# Patient Record
Sex: Female | Born: 1990
Health system: Southern US, Community
[De-identification: ages and names within clinical notes are randomized; demographics above are authoritative.]

## PROBLEM LIST (undated history)

## (undated) ENCOUNTER — Emergency Department: Admission: EM | Discharge status: Absent without leave | Payer: BLUE CROSS/BLUE SHIELD

## (undated) DIAGNOSIS — D649 Anemia, unspecified: Secondary | ICD-10-CM

## (undated) DIAGNOSIS — Z9289 Personal history of other medical treatment: Secondary | ICD-10-CM

---
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
## 2010-02-27 ENCOUNTER — Other Ambulatory Visit: Payer: Self-pay | Admitting: Emergency Medicine

## 2010-02-28 ENCOUNTER — Observation Stay (HOSPITAL_COMMUNITY): Admission: AD | Admit: 2010-02-28 | Discharge: 2010-02-28 | Payer: Self-pay | Admitting: Obstetrics and Gynecology

## 2010-06-08 ENCOUNTER — Emergency Department (HOSPITAL_COMMUNITY): Admission: EM | Admit: 2010-06-08 | Discharge: 2010-06-08 | Payer: Self-pay | Admitting: Emergency Medicine

## 2011-02-15 LAB — POCT I-STAT, CHEM 8
BUN: 6 mg/dL (ref 6–23)
Calcium, Ion: 1.14 mmol/L (ref 1.12–1.32)
Chloride: 113 mEq/L — ABNORMAL HIGH (ref 96–112)
Creatinine, Ser: 0.8 mg/dL (ref 0.4–1.2)
Glucose, Bld: 102 mg/dL — ABNORMAL HIGH (ref 70–99)
HCT: 37 % (ref 36.0–46.0)
Hemoglobin: 12.6 g/dL (ref 12.0–15.0)
Potassium: 4 mEq/L (ref 3.5–5.1)
Sodium: 142 mEq/L (ref 135–145)
TCO2: 18 mmol/L (ref 0–100)

## 2011-02-18 LAB — VON WILLEBRAND FACTOR MULTIMER
Factor-VIII Activity: 144 % (ref 50–180)
Ristocetin Co-Factor: 130 % (ref 42–200)
Von Willebrand Factor Ag: 167 % (ref 50–217)

## 2011-02-18 LAB — CROSSMATCH
ABO/RH(D): A POS
Antibody Screen: NEGATIVE

## 2011-02-18 LAB — PLATELET FUNCTION ASSAY: Collagen / Epinephrine: 143 seconds (ref 0–184)

## 2011-02-18 LAB — CBC
HCT: 24.8 % — ABNORMAL LOW (ref 36.0–46.0)
Hemoglobin: 8 g/dL — ABNORMAL LOW (ref 12.0–15.0)
MCHC: 32 g/dL (ref 30.0–36.0)
MCV: 76.8 fL — ABNORMAL LOW (ref 78.0–100.0)
Platelets: 217 10*3/uL (ref 150–400)
RBC: 3.23 MIL/uL — ABNORMAL LOW (ref 3.87–5.11)
RDW: 26.2 % — ABNORMAL HIGH (ref 11.5–15.5)
WBC: 9.5 10*3/uL (ref 4.0–10.5)

## 2011-02-18 LAB — APTT: aPTT: 29 seconds (ref 24–37)

## 2011-02-18 LAB — PROTIME-INR
INR: 1.15 (ref 0.00–1.49)
Prothrombin Time: 14.6 seconds (ref 11.6–15.2)

## 2011-02-18 LAB — ABO/RH: ABO/RH(D): A POS

## 2011-02-23 LAB — CBC
HCT: 15.8 % — ABNORMAL LOW (ref 36.0–46.0)
Hemoglobin: 4.9 g/dL — CL (ref 12.0–15.0)
MCHC: 31.2 g/dL (ref 30.0–36.0)
MCV: 63.1 fL — ABNORMAL LOW (ref 78.0–100.0)
Platelets: 324 10*3/uL (ref 150–400)
RBC: 2.5 MIL/uL — ABNORMAL LOW (ref 3.87–5.11)
RDW: 19.1 % — ABNORMAL HIGH (ref 11.5–15.5)
WBC: 9 10*3/uL (ref 4.0–10.5)

## 2011-02-23 LAB — BASIC METABOLIC PANEL
BUN: 11 mg/dL (ref 6–23)
CO2: 23 mEq/L (ref 19–32)
Calcium: 9 mg/dL (ref 8.4–10.5)
Chloride: 105 mEq/L (ref 96–112)
Creatinine, Ser: 0.75 mg/dL (ref 0.4–1.2)
GFR calc Af Amer: >60 mL/min (ref >60)
GFR calc non Af Amer: >60 mL/min (ref >60)
Glucose, Bld: 110 mg/dL — ABNORMAL HIGH (ref 70–99)
Potassium: 3.4 mEq/L — ABNORMAL LOW (ref 3.5–5.1)
Sodium: 136 mEq/L (ref 135–145)

## 2011-02-23 LAB — DIFFERENTIAL
Band Neutrophils: 0 % (ref 0–10)
Basophils Absolute: 0 10*3/uL (ref 0.0–0.1)
Basophils Relative: 0 % (ref 0–1)
Blasts: 0 %
Eosinophils Absolute: 0.1 10*3/uL (ref 0.0–0.7)
Eosinophils Relative: 1 % (ref 0–5)
Lymphocytes Relative: 33 % (ref 12–46)
Lymphs Abs: 3 10*3/uL (ref 0.7–4.0)
Metamyelocytes Relative: 0 %
Monocytes Absolute: 0.4 10*3/uL (ref 0.1–1.0)
Monocytes Relative: 4 % (ref 3–12)
Myelocytes: 0 %
Neutro Abs: 5.5 10*3/uL (ref 1.7–7.7)
Neutrophils Relative %: 62 % (ref 43–77)
Promyelocytes Absolute: 0 %
nRBC: 0 /100 WBC

## 2011-02-23 LAB — TYPE AND SCREEN
ABO/RH(D): A POS
Antibody Screen: NEGATIVE

## 2011-02-23 LAB — RETICULOCYTES
RBC.: 2.35 MIL/uL — ABNORMAL LOW (ref 3.87–5.11)
Retic Count, Absolute: 32.9 10*3/uL (ref 19.0–186.0)
Retic Ct Pct: 1.4 % (ref 0.4–3.1)

## 2011-02-23 LAB — POCT PREGNANCY, URINE: Preg Test, Ur: NEGATIVE

## 2011-02-23 LAB — IRON AND TIBC
Iron: <10 ug/dL — ABNORMAL LOW (ref 42–135)
UIBC: 369 ug/dL

## 2011-02-23 LAB — FOLATE: Folate: 13.6 ng/mL

## 2011-02-23 LAB — VITAMIN B12: Vitamin B-12: 289 pg/mL (ref 211–911)

## 2011-02-23 LAB — ABO/RH: ABO/RH(D): A POS

## 2011-02-23 LAB — FERRITIN: Ferritin: 1 ng/mL — ABNORMAL LOW (ref 10–291)

## 2014-08-29 ENCOUNTER — Observation Stay (HOSPITAL_COMMUNITY)
Admission: EM | Admit: 2014-08-29 | Discharge: 2014-08-30 | Disposition: A | Discharge status: Home / Self Care | Payer: 59 | Attending: Obstetrics & Gynecology | Admitting: Obstetrics & Gynecology

## 2014-08-29 ENCOUNTER — Encounter (HOSPITAL_COMMUNITY): Payer: Self-pay | Admitting: Emergency Medicine

## 2014-08-29 DIAGNOSIS — R5383 Other fatigue: Secondary | ICD-10-CM | POA: Insufficient documentation

## 2014-08-29 DIAGNOSIS — N926 Irregular menstruation, unspecified: Secondary | ICD-10-CM | POA: Diagnosis present

## 2014-08-29 DIAGNOSIS — D649 Anemia, unspecified: Secondary | ICD-10-CM | POA: Diagnosis not present

## 2014-08-29 DIAGNOSIS — N939 Abnormal uterine and vaginal bleeding, unspecified: Secondary | ICD-10-CM | POA: Diagnosis not present

## 2014-08-29 DIAGNOSIS — N938 Other specified abnormal uterine and vaginal bleeding: Principal | ICD-10-CM | POA: Insufficient documentation

## 2014-08-29 DIAGNOSIS — R55 Syncope and collapse: Secondary | ICD-10-CM | POA: Insufficient documentation

## 2014-08-29 DIAGNOSIS — D582 Other hemoglobinopathies: Secondary | ICD-10-CM | POA: Insufficient documentation

## 2014-08-29 DIAGNOSIS — N920 Excessive and frequent menstruation with regular cycle: Secondary | ICD-10-CM | POA: Diagnosis present

## 2014-08-29 DIAGNOSIS — R0602 Shortness of breath: Secondary | ICD-10-CM | POA: Insufficient documentation

## 2014-08-29 HISTORY — DX: Personal history of other medical treatment: Z92.89

## 2014-08-29 HISTORY — DX: Anemia, unspecified: D64.9

## 2014-08-29 NOTE — ED Notes (Signed)
Pt in triage eating a salad, pt alert and oriented x4

## 2014-08-29 NOTE — ED Provider Notes (Signed)
CSN: [id number]     Arrival date & time 08/29/14  2012 History   First MD Initiated Contact with Patient 08/29/14 2310     Chief Complaint  Patient presents with  . Abnormal Lab     (Consider location/radiation/quality/duration/timing/severity/associated sxs/prior Treatment) HPI Taylor White is a 23 y.o. female with past medical history of the regular vaginal bleeding coming in with low blood count. Patient has had viral URI like symptoms for the past 5 days. She describes headache rhinorrhea and a nonproductive cough. She was seen in urgent care where they obtained blood work and hemoglobin was 5.6. Patient was sent here for admission and blood transfusion. She states she's had vaginal bleeding for the past 3 weeks. She states this occurs to her approximately once a year and she's required transfusions in the past.  She denies any history of bleeding disorders in the family. She was taking birth control pills but stopped taking them one year ago. She denies any new birth control over the past couple of months. She has no dysuria hematuria or vaginal discharge. She's had no fevers chills or diaphoresis. She does admit to shortness of breath, fatigue, and syncope. 4 days ago while walking in the park patient became lightheaded and had a syncopal episode but never sought care at that time. Patient has no further complaints.  10 Systems reviewed and are negative for acute change except as noted in the HPI.    Past Medical History  Diagnosis Date  . Anemia   . H/O transfusion of packed red blood cells    History reviewed. No pertinent past surgical history. History reviewed. No pertinent family history. History  Substance Use Topics  . Smoking status: Current Every Day Smoker  . Smokeless tobacco: Not on file  . Alcohol Use: Yes   OB History   Grav Para Term Preterm Abortions TAB SAB Ect Mult Living                 Review of Systems    Allergies  Review of patient's allergies  indicates no known allergies.  Home Medications   Prior to Admission medications   Not on File   BP 130/68  Pulse 112  Temp(Src) 99.1 F (37.3 C) (Oral)  Resp 16  Ht 5\' 3"  (1.6 m)  Wt 120 lb (54.432 kg)  BMI 21.26 kg/m2  SpO2 100%  LMP 08/29/2014 Physical Exam  Nursing note and vitals reviewed. Constitutional: She is oriented to person, place, and time. She appears well-developed and well-nourished. No distress.  HENT:  Head: Normocephalic and atraumatic.  Nose: Nose normal.  Mouth/Throat: Oropharynx is clear and moist. No oropharyngeal exudate.  Eyes: Conjunctivae and EOM are normal. Pupils are equal, round, and reactive to light. No scleral icterus.  Conjunctivae are not pale on exam.  Neck: Normal range of motion. Neck supple. No JVD present. No tracheal deviation present. No thyromegaly present.  Cardiovascular: Regular rhythm and normal heart sounds.  Exam reveals no gallop and no friction rub.   No murmur heard. Tachycardia  Pulmonary/Chest: Effort normal and breath sounds normal. No respiratory distress. She has no wheezes. She exhibits no tenderness.  Abdominal: Soft. Bowel sounds are normal. She exhibits no distension and no mass. There is no tenderness. There is no rebound and no guarding.  Genitourinary: Vagina normal. No vaginal discharge found.  Mild to moderate amount of blood seen in the vaginal vault. No source other than the cervix identified.  Musculoskeletal: Normal range of motion.  She exhibits no edema and no tenderness.  Lymphadenopathy:    She has no cervical adenopathy.  Neurological: She is alert and oriented to person, place, and time. No cranial nerve deficit. She exhibits normal muscle tone.  Skin: Skin is warm and dry. No rash noted. She is not diaphoretic. No erythema. No pallor.    ED Course  Procedures (including critical care time) Labs Review Labs Reviewed  WET PREP, GENITAL - Abnormal; Notable for the following:    Clue Cells Wet Prep  HPF POC FEW (*)    All other components within normal limits  CBC WITH DIFFERENTIAL - Abnormal; Notable for the following:    RBC 2.61 (*)    Hemoglobin 5.5 (*)    HCT 18.1 (*)    MCV 69.3 (*)    MCH 21.1 (*)    RDW 16.2 (*)    All other components within normal limits  GC/CHLAMYDIA PROBE AMP  BASIC METABOLIC PANEL  HCG, SERUM, QUALITATIVE  URINALYSIS, ROUTINE W REFLEX MICROSCOPIC  TYPE AND SCREEN  PREPARE RBC (CROSSMATCH)    Imaging Review No results found.   EKG Interpretation None      MDM   Final diagnoses:  None    Patient presents emergency department out of concern for her low blood count. She's had irregular vaginal bleeding in the past requiring blood transfusion. She denies being told a diagnosis for this. She's currently not having any abdominal pain. Will evaluate with labs and a pelvic exam.  Hemoglobin is 5.5. Patient was ordered 2 units blood transfusion. I spoke with Dr. standard at [facility] hospital who will admit this patient for continued care.    Tomasita Crumble, MD 08/30/14 [phone]

## 2014-08-29 NOTE — ED Notes (Signed)
Pt presents with headache, sinus pressure, and poor appetite. [facility], they ran a blood test and sent pt here for as blood transfusion for a Hgb of 5.6. Pt states she has a hx of the same. Pt has paperwork with her to show low Hgb

## 2014-08-30 ENCOUNTER — Observation Stay (HOSPITAL_COMMUNITY): Payer: 59

## 2014-08-30 ENCOUNTER — Encounter (HOSPITAL_COMMUNITY): Payer: Self-pay | Admitting: *Deleted

## 2014-08-30 DIAGNOSIS — D582 Other hemoglobinopathies: Secondary | ICD-10-CM | POA: Diagnosis present

## 2014-08-30 DIAGNOSIS — R55 Syncope and collapse: Secondary | ICD-10-CM | POA: Diagnosis not present

## 2014-08-30 DIAGNOSIS — N938 Other specified abnormal uterine and vaginal bleeding: Secondary | ICD-10-CM | POA: Diagnosis not present

## 2014-08-30 DIAGNOSIS — N920 Excessive and frequent menstruation with regular cycle: Secondary | ICD-10-CM | POA: Diagnosis present

## 2014-08-30 DIAGNOSIS — D649 Anemia, unspecified: Secondary | ICD-10-CM | POA: Diagnosis not present

## 2014-08-30 DIAGNOSIS — R0602 Shortness of breath: Secondary | ICD-10-CM | POA: Diagnosis not present

## 2014-08-30 DIAGNOSIS — R5383 Other fatigue: Secondary | ICD-10-CM | POA: Diagnosis not present

## 2014-08-30 LAB — BASIC METABOLIC PANEL
Anion gap: 14 (ref 5–15)
BUN: 8 mg/dL (ref 6–23)
CO2: 23 mEq/L (ref 19–32)
Calcium: 9 mg/dL (ref 8.4–10.5)
Chloride: 102 mEq/L (ref 96–112)
Creatinine, Ser: 0.77 mg/dL (ref 0.50–1.10)
GFR calc Af Amer: >90 mL/min (ref >90)
GFR calc non Af Amer: >90 mL/min (ref >90)
Glucose, Bld: 90 mg/dL (ref 70–99)
Potassium: 3.8 mEq/L (ref 3.7–5.3)
Sodium: 139 mEq/L (ref 137–147)

## 2014-08-30 LAB — CBC
HCT: 28.7 % — ABNORMAL LOW (ref 36.0–46.0)
Hemoglobin: 9.3 g/dL — ABNORMAL LOW (ref 12.0–15.0)
MCH: 24.5 pg — ABNORMAL LOW (ref 26.0–34.0)
MCHC: 32.4 g/dL (ref 30.0–36.0)
MCV: 75.7 fL — ABNORMAL LOW (ref 78.0–100.0)
Platelets: 261 10*3/uL (ref 150–400)
RBC: 3.79 MIL/uL — ABNORMAL LOW (ref 3.87–5.11)
RDW: 17.8 % — ABNORMAL HIGH (ref 11.5–15.5)
WBC: 6.3 10*3/uL (ref 4.0–10.5)

## 2014-08-30 LAB — URINE MICROSCOPIC-ADD ON

## 2014-08-30 LAB — CBC WITH DIFFERENTIAL/PLATELET
Basophils Absolute: 0 10*3/uL (ref 0.0–0.1)
Basophils Relative: 0 % (ref 0–1)
Eosinophils Absolute: 0.2 10*3/uL (ref 0.0–0.7)
Eosinophils Relative: 3 % (ref 0–5)
HCT: 18.1 % — ABNORMAL LOW (ref 36.0–46.0)
Hemoglobin: 5.5 g/dL — CL (ref 12.0–15.0)
Lymphocytes Relative: 26 % (ref 12–46)
Lymphs Abs: 1.8 10*3/uL (ref 0.7–4.0)
MCH: 21.1 pg — ABNORMAL LOW (ref 26.0–34.0)
MCHC: 30.4 g/dL (ref 30.0–36.0)
MCV: 69.3 fL — ABNORMAL LOW (ref 78.0–100.0)
Monocytes Absolute: 0.6 10*3/uL (ref 0.1–1.0)
Monocytes Relative: 9 % (ref 3–12)
Neutro Abs: 4.3 10*3/uL (ref 1.7–7.7)
Neutrophils Relative %: 62 % (ref 43–77)
Platelets: 308 10*3/uL (ref 150–400)
RBC: 2.61 MIL/uL — ABNORMAL LOW (ref 3.87–5.11)
RDW: 16.2 % — ABNORMAL HIGH (ref 11.5–15.5)
WBC: 6.9 10*3/uL (ref 4.0–10.5)

## 2014-08-30 LAB — WET PREP, GENITAL
Trich, Wet Prep: NONE SEEN
WBC, Wet Prep HPF POC: NONE SEEN
Yeast Wet Prep HPF POC: NONE SEEN

## 2014-08-30 LAB — URINALYSIS, ROUTINE W REFLEX MICROSCOPIC
Bilirubin Urine: NEGATIVE
Glucose, UA: NEGATIVE mg/dL
Ketones, ur: 15 mg/dL — AB
Nitrite: POSITIVE — AB
Protein, ur: >300 mg/dL — AB
Specific Gravity, Urine: 1.02 (ref 1.005–1.030)
Urobilinogen, UA: 4 mg/dL — ABNORMAL HIGH (ref 0.0–1.0)
pH: 6.5 (ref 5.0–8.0)

## 2014-08-30 LAB — PREPARE RBC (CROSSMATCH)

## 2014-08-30 LAB — TSH: TSH: 1.33 u[IU]/mL (ref 0.350–4.500)

## 2014-08-30 LAB — HCG, SERUM, QUALITATIVE: Preg, Serum: NEGATIVE

## 2014-08-30 LAB — HCG, QUANTITATIVE, PREGNANCY: hCG, Beta Chain, Quant, S: 1 m[IU]/mL (ref <5)

## 2014-08-30 MED ORDER — MEDROXYPROGESTERONE ACETATE 150 MG/ML IM SUSP
150.0000 mg | Freq: Once | INTRAMUSCULAR | Status: AC
  Administered 2014-08-30: 150 mg via INTRAMUSCULAR
  Filled 2014-08-30: qty 1

## 2014-08-30 MED ORDER — SODIUM CHLORIDE 0.9 % IV SOLN: Freq: Once | INTRAVENOUS | Status: DC

## 2014-08-30 MED ORDER — PRENATAL MULTIVITAMIN CH
1.0000 | ORAL_TABLET | Freq: Every day | ORAL | Status: DC
  Administered 2014-08-30: 1 via ORAL
  Filled 2014-08-30: qty 1

## 2014-08-30 MED ORDER — AZITHROMYCIN 250 MG PO TABS: ORAL_TABLET | ORAL | Status: DC

## 2014-08-30 MED ORDER — SODIUM CHLORIDE 0.9 % IV SOLN
Freq: Once | INTRAVENOUS | Status: AC
  Administered 2014-08-30: 07:00:00 via INTRAVENOUS

## 2014-08-30 MED ORDER — ACETAMINOPHEN 500 MG PO TABS
1000.0000 mg | ORAL_TABLET | Freq: Once | ORAL | Status: AC
  Administered 2014-08-30: 1000 mg via ORAL
  Filled 2014-08-30: qty 2

## 2014-08-30 MED ORDER — SODIUM CHLORIDE 0.9 % IV SOLN
10.0000 mL/h | Freq: Once | INTRAVENOUS | Status: AC
  Administered 2014-08-30: 10 mL/h via INTRAVENOUS

## 2014-08-30 NOTE — Discharge Instructions (Signed)
Abnormal Uterine Bleeding Abnormal uterine bleeding can affect women at various stages in life, including teenagers, women in their reproductive years, pregnant women, and women who have reached menopause. Several kinds of uterine bleeding are considered abnormal, including:  Bleeding or spotting between periods.   Bleeding after sexual intercourse.   Bleeding that is heavier or more than normal.   Periods that last longer than usual.  Bleeding after menopause.  Many cases of abnormal uterine bleeding are minor and simple to treat, while others are more serious. Any type of abnormal bleeding should be evaluated by your health care provider. Treatment will depend on the cause of the bleeding. HOME CARE INSTRUCTIONS Monitor your condition for any changes. The following actions may help to alleviate any discomfort you are experiencing:  Avoid the use of tampons and douches as directed by your health care provider.  Change your pads frequently. You should get regular pelvic exams and Pap tests. Keep all follow-up appointments for diagnostic tests as directed by your health care provider.  SEEK MEDICAL CARE IF:   Your bleeding lasts more than 1 week.   You feel dizzy at times.  SEEK IMMEDIATE MEDICAL CARE IF:   You pass out.   You are changing pads every 15 to 30 minutes.   You have abdominal pain.  You have a fever.   You become sweaty or weak.   You are passing large blood clots from the vagina.   You start to feel nauseous and vomit. MAKE SURE YOU:   Understand these instructions.  Will watch your condition.  Will get help right away if you are not doing well or get worse. Document Released: 11/16/2005 Document Revised: 11/21/2013 Document Reviewed: 06/15/2013 ExitCare Patient Information 2015 [employer]. This information is not intended to replace advice given to you by your health care provider. Make sure you discuss any questions you have with your  health care provider.  

## 2014-08-30 NOTE — Progress Notes (Signed)
UR completed 

## 2014-08-30 NOTE — ED Notes (Signed)
Pt reports she has been having a heavy menstrual cycle x3 weeks, pt reports a heavy blood flow with large blood clots. Pt reports changing her sanitary pads more often than normal. Pt alert and oriented x4. Pt currently sleeping, RN at bedside while initial blood administration is going.

## 2014-08-30 NOTE — ED Notes (Signed)
Pt c/o pain at IV site, IV line flushed and patent, no signs of swelling noted to site. 2nd RN Windy KalataYasemia RN at bedside to exam IV site as well. This RN offered to start a 2nd line for her, pt refused a second IV start at this time. Pt states "I'll just keep it."

## 2014-08-30 NOTE — H&P (Signed)
Taylor White is a 23 y.o. female with past medical history of the regular vaginal bleeding coming in with low blood count. Patient has had viral URI like symptoms for the past 5 days. She describes headache rhinorrhea and a nonproductive cough. She was seen in urgent care where they obtained blood work and hemoglobin was 5.6. Patient was sent here for admission and blood transfusion. She states she's had vaginal bleeding for the past 3 weeks. She says that her bleeding is much lighter for the last few days. She used depo provera for several years in the past and had no heavy bleeding during that time. She states this occurs to her approximately once a year and she's required 2 transfusions in the past. She denies any history of bleeding disorders in the family. She was taking birth control pills but stopped taking them one year ago. She denies any new birth control over the past couple of months. She has no dysuria hematuria or vaginal discharge. She's had no fevers chills or diaphoresis. She does admit to shortness of breath, fatigue, and syncope. 4 days ago while walking in the park patient became lightheaded and had a syncopal episode but never sought care at that time. Patient has no further complaints.  10 Systems reviewed and are negative for acute change except as noted in the HPI.  Past Medical History   Diagnosis  Date   .  Anemia    .  H/O transfusion of packed red blood cells    History reviewed. No pertinent past surgical history.  History reviewed. No pertinent family history.  History   Substance Use Topics   .  Smoking status:  Current Every Day Smoker   .  Smokeless tobacco:  Not on file   .  Alcohol Use:  Yes    OB History    Grav  Para  Term  Preterm  Abortions  TAB  SAB  Ect  Mult  Living                 Review of Systems  Allergies   Review of patient's allergies indicates no known allergies.  Home Medications    Prior to Admission medications   Not on File   BP 130/68   Pulse 112  Temp(Src) 99.1 F (37.3 C) (Oral)  Resp 16  Ht 5\' 3"  (1.6 m)  Wt 120 lb (54.432 kg)  BMI 21.26 kg/m2  SpO2 100%  LMP 08/29/2014  Physical Exam  Nursing note and vitals reviewed.  Constitutional: She is oriented to person, place, and time. She appears well-developed and well-nourished. No distress.  HENT:  Head: Normocephalic and atraumatic.  Nose: Nose normal.  Mouth/Throat: Oropharynx is clear and moist. No oropharyngeal exudate.  Eyes: Conjunctivae and EOM are normal. Pupils are equal, round, and reactive to light. No scleral icterus.  Conjunctivae are not pale on exam.  Neck: Normal range of motion. Neck supple. No JVD present. No tracheal deviation present. No thyromegaly present.  Cardiovascular: Regular rhythm and normal heart sounds. Exam reveals no gallop and no friction rub.  No murmur heard. Tachycardia  Pulmonary/Chest: Effort normal and breath sounds normal. No respiratory distress. She has no wheezes. She exhibits no tenderness.  Abdominal: Soft. Bowel sounds are normal. She exhibits no distension and no mass. There is no tenderness. There is no rebound and no guarding.  Genitourinary: Vagina normal. No vaginal discharge found.  Mild to moderate amount of blood seen in the vaginal vault. No source  other than the cervix identified.  Musculoskeletal: Normal range of motion. She exhibits no edema and no tenderness.  Lymphadenopathy:  She has no cervical adenopathy.  Neurological: She is alert and oriented to person, place, and time. No cranial nerve deficit. She exhibits normal muscle tone.  Skin: Skin is warm and dry. No rash noted. She is not diaphoretic. No erythema. No pallor.  ED Course   Procedures (including critical care time)  Labs Review  Labs Reviewed   WET PREP, GENITAL - Abnormal; Notable for the following:    Clue Cells Wet Prep HPF POC  FEW (*)     All other components within normal limits   CBC WITH DIFFERENTIAL - Abnormal; Notable for the  following:    RBC  2.61 (*)     Hemoglobin  5.5 (*)     HCT  18.1 (*)     MCV  69.3 (*)     MCH  21.1 (*)     RDW  16.2 (*)     All other components within normal limits   GC/CHLAMYDIA PROBE AMP   BASIC METABOLIC PANEL   HCG, SERUM, QUALITATIVE   URINALYSIS, ROUTINE W REFLEX MICROSCOPIC   TYPE AND SCREEN   PREPARE RBC (CROSSMATCH)    No blood is seen on her pad at this time.  A/P. Symptomatic anemia.- She [facility] and I will give her another 2 units here. She will restart depo provera if her HCG is negative. I will get an u/s and work up for menorrhagia.

## 2014-08-30 NOTE — Progress Notes (Signed)
Pt and her mother verbalize understanding of d/c instructions, medication, future office appt., when to seek medical care, and signs of emergency. IVs were d/c. VS WNL at time of D/C. No questions at this time. Pt d/c to main entrance accompanied by NT. Pts mother will be driving her home. Pt received follow up CBC, as well as pelvic U/S as ordered prior to d/c. Pt also received Depo-Provera in rt deltoid prior to d/c. Sheryn BisonGordon, Jaylynn Siefert Warner

## 2014-08-30 NOTE — Plan of Care (Signed)
Problem: Phase I Progression Outcomes Goal: Pain controlled with appropriate interventions Outcome: Not Applicable Date Met:  92/00/41 Not having any complaints of pain

## 2014-08-30 NOTE — ED Notes (Signed)
A second IV was started per pt request. Pt states "that IV hurts too." IV team paged for further assistance. Pt denies pain at the first IV insertion at this time. IV RN at bedside

## 2014-08-30 NOTE — Discharge Summary (Signed)
Physician Discharge Summary  Patient ID: Taylor White MRN: [id number] DOB/AGE: Apr 12, 1991 23 y.o.  Admit date: 08/29/2014 Discharge date: 08/30/2014  Admission Diagnoses:  Menorrhagia, anemia, URI  Discharge Diagnoses: The same  Treatments: Transfusion of 3 units pRBCs  Hospital Course: 23 y.o. F [facility] anemia in the setting of menorrhagia, hemoglobin of 5.5. Of note, she presented initially with chronic URI and was incidentally noted toi be anemic.  No current heavy bleeding.  She received one unit of pRBCs and [facility] Here, she received 2 additional units and underwent a pelvic ultrasound. Discharge hemoglobin was 9.3.  Patient received one shot of Depo Provera prior to discharge; she will follow up in the GYN clinic. Z-pack prescribed for URI.  Significant Diagnostic Studies:  CBC Latest Ref Rng 08/30/2014 08/29/2014 06/08/2010  WBC 4.0 - 10.5 K/uL 6.3 6.9 -  Hemoglobin 12.0 - 15.0 g/dL 1.9(J9.3(L) 5.5(LL) 12.6  Hematocrit 36.0 - 46.0 % 28.7(L) 18.1(L) 37.0  Platelets 150 - 400 K/uL 261 308 -   08/30/2014   TRANSABDOMINAL AND TRANSVAGINAL ULTRASOUND OF PELVIS CLINICAL DATA:  Menorrhagia.  Anemia.  LMP 08/09/2014    TECHNIQUE: Both transabdominal and transvaginal ultrasound examinations of the pelvis were performed. Transabdominal technique was performed for global imaging of the pelvis including uterus, ovaries, adnexal regions, and pelvic cul-de-sac. It was necessary to proceed with endovaginal exam following the transabdominal exam to visualize the endometrial stripe and ovaries.  COMPARISON:  None  FINDINGS: Uterus  Measurements: 7.3 x 3.8 x 5.6 cm. No fibroids or other mass visualized.  Endometrium  Thickness: 4 mm.  No focal abnormality visualized.  Right ovary  Measurements: 6.0 x 2.6 x 3.4 cm. No evidence of ovarian mass. Numerous small, less than 1cm, follicles, without evidence of dominant follicle or corpus luteum.  Left ovary  Measurements: 5.4 x 2.2 x  3.8 cm. No evidence of ovarian mass. Numerous small, less than 1cm, follicles, without evidence of dominant follicle or corpus luteum.  Other findings  No free fluid.  IMPRESSION: No evidence of uterine fibroids.  Normal endometrial thickness.  No evidence of ovarian or adnexal mass.  Numerous small bilateral ovarian follicles, without evidence of dominant follicle or corpus luteum. These findings meet sonographic criteria for polycystic ovary syndrome; recommend clinical correlation for clinical signs and symptoms of PCOS, and consider biochemical testing for confirmation if warranted.   Electronically Signed   By: Myles RosenthalJohn  Stahl M.D.   On: 08/30/2014 16:43   Discharged Condition: Stable  Disposition: Stable to home     Medication List         azithromycin 250 MG tablet  Commonly known as:  ZITHROMAX Z-PAK  Take as directed       Follow-up Information   [facility]. (1:15 pm appointment with Dr. Debroah LoopArnold. Call clinic/come to [facility] for any concerning issues)    Contact information:   [address] [place] [address] [phone]      Signed: [name],[name], MD 08/30/2014, 5:37 PM

## 2014-08-31 LAB — TYPE AND SCREEN
ABO/RH(D): A POS
ABO/RH(D): A POS
Antibody Screen: NEGATIVE
Antibody Screen: NEGATIVE
Unit division: 0
Unit division: 0
Unit division: 0
Unit division: 0
Unit division: 0

## 2014-08-31 LAB — GC/CHLAMYDIA PROBE AMP
CT Probe RNA: POSITIVE — AB
GC Probe RNA: NEGATIVE

## 2014-09-03 LAB — VON WILLEBRAND PANEL
Coagulation Factor VIII: 240 % — ABNORMAL HIGH (ref 73–140)
Ristocetin Co-factor, Plasma: 147 % (ref 42–200)
Von Willebrand Antigen, Plasma: 191 % (ref 50–217)

## 2014-09-04 ENCOUNTER — Telehealth (HOSPITAL_COMMUNITY): Payer: Self-pay | Admitting: *Deleted

## 2014-09-04 MED ORDER — AZITHROMYCIN 500 MG PO TABS: 1000.0000 mg | ORAL_TABLET | Freq: Once | ORAL | Status: DC

## 2014-09-04 NOTE — Telephone Encounter (Signed)
Zithromax 1gm e-prescribed to patient's pharmacy per protocol.  

## 2014-09-04 NOTE — Addendum Note (Signed)
Addended by: Louanna RawAMPBELL, Carollynn Pennywell M on: 09/04/2014 09:36 AM   Modules accepted: Orders

## 2014-09-04 NOTE — Telephone Encounter (Signed)
Telephone call to patient regarding positive chlamydia culture, patient notified.  Patient has not been treated and will need Rx called in per protocol to [facility] [address]  Instructed patient to notify her partner for treatment.  Report faxed to health department.

## 2014-09-04 NOTE — Telephone Encounter (Signed)
Message copied by Pennie BanterSMITH, MARNI W on Tue Sep 04, 2014  9:20 AM ------      Message from: Allie BossierVE, MYRA C      Created: Tue Sep 04, 2014  9:02 AM       Please make sure that she gets treatment for +chlamydia. She took a z pack for a URI, but she will also need to take a separate 1 gram dose of zithromax. Her partner should also be treated. She should use condoms until both test negative next month.      Thanks ------

## 2014-10-04 ENCOUNTER — Ambulatory Visit: Payer: 59 | Admitting: Obstetrics & Gynecology

## 2015-01-11 ENCOUNTER — Other Ambulatory Visit (HOSPITAL_COMMUNITY)
Admission: RE | Admit: 2015-01-11 | Discharge: 2015-01-11 | Disposition: A | Discharge status: Home / Self Care | Payer: BLUE CROSS/BLUE SHIELD | Source: Ambulatory Visit | Attending: Obstetrics & Gynecology | Admitting: Obstetrics & Gynecology

## 2015-01-11 ENCOUNTER — Other Ambulatory Visit: Payer: Self-pay | Admitting: Obstetrics & Gynecology

## 2015-01-11 DIAGNOSIS — Z01419 Encounter for gynecological examination (general) (routine) without abnormal findings: Secondary | ICD-10-CM | POA: Diagnosis not present

## 2015-01-11 DIAGNOSIS — Z113 Encounter for screening for infections with a predominantly sexual mode of transmission: Secondary | ICD-10-CM | POA: Insufficient documentation

## 2015-01-14 LAB — CYTOLOGY - PAP

## 2017-01-17 ENCOUNTER — Emergency Department (HOSPITAL_COMMUNITY)
Admission: EM | Admit: 2017-01-17 | Discharge: 2017-01-17 | Disposition: A | Discharge status: Home / Self Care | Payer: BLUE CROSS/BLUE SHIELD | Attending: Emergency Medicine | Admitting: Emergency Medicine

## 2017-01-17 ENCOUNTER — Encounter (HOSPITAL_COMMUNITY): Payer: Self-pay | Admitting: Emergency Medicine

## 2017-01-17 DIAGNOSIS — N921 Excessive and frequent menstruation with irregular cycle: Secondary | ICD-10-CM | POA: Diagnosis not present

## 2017-01-17 DIAGNOSIS — J111 Influenza due to unidentified influenza virus with other respiratory manifestations: Secondary | ICD-10-CM

## 2017-01-17 DIAGNOSIS — Z79899 Other long term (current) drug therapy: Secondary | ICD-10-CM | POA: Diagnosis not present

## 2017-01-17 DIAGNOSIS — N926 Irregular menstruation, unspecified: Secondary | ICD-10-CM

## 2017-01-17 DIAGNOSIS — R69 Illness, unspecified: Secondary | ICD-10-CM

## 2017-01-17 DIAGNOSIS — F17200 Nicotine dependence, unspecified, uncomplicated: Secondary | ICD-10-CM | POA: Insufficient documentation

## 2017-01-17 DIAGNOSIS — R0981 Nasal congestion: Secondary | ICD-10-CM | POA: Diagnosis present

## 2017-01-17 LAB — I-STAT BETA HCG BLOOD, ED (MC, WL, AP ONLY): I-stat hCG, quantitative: <5 m[IU]/mL (ref <5)

## 2017-01-17 LAB — I-STAT CHEM 8, ED
BUN: 5 mg/dL — ABNORMAL LOW (ref 6–20)
Calcium, Ion: 1.13 mmol/L — ABNORMAL LOW (ref 1.15–1.40)
Chloride: 105 mmol/L (ref 101–111)
Creatinine, Ser: 0.7 mg/dL (ref 0.44–1.00)
Glucose, Bld: 83 mg/dL (ref 65–99)
HCT: 43 % (ref 36.0–46.0)
Hemoglobin: 14.6 g/dL (ref 12.0–15.0)
Potassium: 3.6 mmol/L (ref 3.5–5.1)
Sodium: 140 mmol/L (ref 135–145)
TCO2: 22 mmol/L (ref 0–100)

## 2017-01-17 MED ORDER — OSELTAMIVIR PHOSPHATE 75 MG PO CAPS
75.0000 mg | ORAL_CAPSULE | Freq: Once | ORAL | Status: AC
  Administered 2017-01-17: 75 mg via ORAL
  Filled 2017-01-17: qty 1

## 2017-01-17 MED ORDER — OSELTAMIVIR PHOSPHATE 75 MG PO CAPS: 75.0000 mg | ORAL_CAPSULE | Freq: Two times a day (BID) | ORAL | 0 refills | Status: DC

## 2017-01-17 NOTE — ED Provider Notes (Signed)
[facility]-EMERGENCY DEPT Provider Note   CSN: [id number] Arrival date & time: 01/17/17  4098     History   Chief Complaint Chief Complaint  Patient presents with  . Vaginal Bleeding  . Nasal Congestion    HPI   Blood pressure 108/75, pulse 89, temperature 99 F (37.2 C), temperature source Oral, resp. rate 17, height 5\' 3"  (1.6 m), weight 60.8 kg, last menstrual period 10/31/2016, SpO2 98 %.  Taylor White is a 26 y.o. female complaining of heavy menses over the course of the last several weeks she states that she is going through approximately 3-4 pads per day. She was due for her Depo-Provera shot at the end of January and beginning of February however she missed this appointment because she had an outstanding bill at her OB/GYN which has since been resolved. She does have a history of transfusion for menorrhagia she reports that this morning she started feeling lightheaded with palpitations. She denies syncope, chest pain, shortness of breath, dyspnea on exertion she does endorse a moderate lower abdominal pain which she took Excedrin for with good relief. She also takes iron for anemia states she's been compliant with that.  Also reports myalgia, sore throat and fatigue onset yesterday. She denies rhinorrhea, cough, headache, cervicalgia, shortness of breath. She works at [employer] and is concerned she has the flu.   Past Medical History:  Diagnosis Date  . Anemia   . H/O transfusion of packed red blood cells     Patient Active Problem List   Diagnosis Date Noted  . Anemia 08/30/2014  . Menorrhagia 08/30/2014    History reviewed. No pertinent surgical history.  OB History    No data available       Home Medications    Prior to Admission medications   Medication Sig Start Date End Date Taking? Authorizing Provider  azithromycin (ZITHROMAX Z-PAK) 250 MG tablet Take as directed 08/30/14   Tereso Newcomer, MD  azithromycin (ZITHROMAX) 500 MG tablet Take 2 tablets (1,000 mg  total) by mouth once. 09/04/14   Allie Bossier, MD  oseltamivir (TAMIFLU) 75 MG capsule Take 1 capsule (75 mg total) by mouth every 12 (twelve) hours. 01/17/17   Joni Reining Conn Trombetta, PA-C    Family History No family history on file.  Social History Social History  Substance Use Topics  . Smoking status: Current Every Day Smoker  . Smokeless tobacco: Current User  . Alcohol use Yes     Allergies   Patient has no known allergies.   Review of Systems Review of Systems  10 systems reviewed and found to be negative, except as noted in the HPI.   Physical Exam Updated Vital Signs BP 107/76 (BP Location: Right Arm)   Pulse 81   Temp 99 F (37.2 C) (Oral)   Resp 16   Ht 5\' 3"  (1.6 m)   Wt 60.8 kg   LMP 10/31/2016   SpO2 100%   BMI 23.74 kg/m   Physical Exam  Constitutional: She is oriented to person, place, and time. She appears well-developed and well-nourished. No distress.  HENT:  Head: Normocephalic and atraumatic.  Right Ear: External ear normal.  Left Ear: External ear normal.  Mouth/Throat: Oropharynx is clear and moist. No oropharyngeal exudate.  No drooling or stridor. Posterior pharynx mildly erythematous no significant tonsillar hypertrophy. No exudate. Soft palate rises symmetrically. No TTP or induration under tongue.   No tenderness to palpation of frontal or bilateral maxillary sinuses.  Mild mucosal edema  in the nares with scant rhinorrhea.  Bilateral tympanic membranes with normal architecture and good light reflex.    Eyes: Conjunctivae and EOM are normal. Pupils are equal, round, and reactive to light.  Neck: Normal range of motion. Neck supple.  Cardiovascular: Normal rate, regular rhythm and intact distal pulses.   Pulmonary/Chest: Effort normal and breath sounds normal. No stridor. No respiratory distress. She has no wheezes. She has no rales. She exhibits no tenderness.  Abdominal: Soft. She exhibits no distension and no mass. There is no  tenderness. There is no rebound and no guarding. No hernia.  Musculoskeletal: Normal range of motion.  Neurological: She is alert and oriented to person, place, and time.  Skin: Capillary refill takes less than 2 seconds. She is not diaphoretic.  Psychiatric: She has a normal mood and affect.  Nursing note and vitals reviewed.    ED Treatments / Results  Labs (all labs ordered are listed, but only abnormal results are displayed) Labs Reviewed  I-STAT CHEM 8, ED - Abnormal; Notable for the following:       Result Value   BUN 5 (*)    Calcium, Ion 1.13 (*)    All other components within normal limits  WET PREP, GENITAL  RPR  HIV ANTIBODY (ROUTINE TESTING)  I-STAT BETA HCG BLOOD, ED ([facility], [facility], [facility] ONLY)  GC/CHLAMYDIA PROBE AMP ([place]) NOT AT [facility]    EKG  EKG Interpretation None       Radiology No results found.  Procedures Procedures (including critical care time)  Medications Ordered in ED Medications  oseltamivir (TAMIFLU) capsule 75 mg (75 mg Oral Given 01/17/17 1057)     Initial Impression / Assessment and Plan / ED Course  I have reviewed the triage vital signs and the nursing notes.  Pertinent labs & imaging results that were available during my care of the patient were reviewed by me and considered in my medical decision making (see chart for details).     Vitals:   01/17/17 1030 01/17/17 1100 01/17/17 1115 01/17/17 1140  BP: 102/67 102/68 99/79 107/76  Pulse: 85 80 87 81  Resp:    16  Temp:      TempSrc:      SpO2: 100% 100% 100% 100%  Weight:      Height:        Medications  oseltamivir (TAMIFLU) capsule 75 mg (75 mg Oral Given 01/17/17 1057)    [name] is 26 y.o. female presenting with Extended menstruation, she states that she's been menstruating regularly for several weeks, she was spotting before then. She missed her Depo-Provera shot which was due about 3 weeks ago. She has had to have transfusions for anemia secondary to heavy  menses in the past. She's taking iron for chronic anemia. Abdominal exam is benign. We'll check hemoglobin and hematocrit.  Patient with influenza-like illness, afebrile in the ED however she does work at [employer] and is likely exposed to multiple flu contacts. Patient started on Tamiflu and given work note.  Patient with no anemia on i-STAT chem 8. Will follow with Ob/Gyn  Evaluation does not show pathology that would require ongoing emergent intervention or inpatient treatment. Pt is hemodynamically stable and mentating appropriately. Discussed findings and plan with patient/guardian, who agrees with care plan. All questions answered. Return precautions discussed and outpatient follow up given.     Final Clinical Impressions(s) / ED Diagnoses   Final diagnoses:  Irregular menstrual bleeding  Influenza-like illness  New Prescriptions Discharge Medication List as of 01/17/2017 11:41 AM    START taking these medications   Details  oseltamivir (TAMIFLU) 75 MG capsule Take 1 capsule (75 mg total) by mouth every 12 (twelve) hours., Starting Sun 01/17/2017, [employer] Jaide Hillenburg, PA-C 01/17/17 1300    Doug Sou, MD 01/17/17 1705

## 2017-01-17 NOTE — Discharge Instructions (Signed)
Return to the emergency room for any worsening or concerning symptoms including fast breathing, heart racing, confusion, vomiting. ° °Rest, cover your mouth when you cough and wash your hands frequently.  ° °Push fluids: water or Gatorade, do not drink any soda, juice or caffeinated beverages. ° °For fever and pain control you can take Motrin (ibuprofen) as follows: 400 mg (this is normally 2 over the counter pills) °Three hours after you have had the Motrin take Tylenol (acetaminophen) as follows: You can take 650 mg (this is normally 2 over-the-counter pills) °Repeat the series by taking Motrin 3 hours later, continue to do this while you are awake. ° °Check to see that any other over-the-counter medications don't contain acetaminophen: you shouldn't have more than 3000 mg a day. ° °Do not return to work until 48 hours after your fever breaks.  ° °

## 2017-01-18 LAB — RPR: RPR Ser Ql: NONREACTIVE

## 2017-01-18 LAB — HIV ANTIBODY (ROUTINE TESTING W REFLEX): HIV Screen 4th Generation wRfx: NONREACTIVE

## 2017-04-21 ENCOUNTER — Emergency Department
Admission: EM | Admit: 2017-04-21 | Discharge: 2017-04-21 | Disposition: A | Discharge status: Home / Self Care | Payer: No Typology Code available for payment source | Attending: Emergency Medicine | Admitting: Emergency Medicine

## 2017-04-21 ENCOUNTER — Emergency Department: Payer: No Typology Code available for payment source

## 2017-04-21 ENCOUNTER — Encounter: Payer: Self-pay | Admitting: Emergency Medicine

## 2017-04-21 DIAGNOSIS — F17290 Nicotine dependence, other tobacco product, uncomplicated: Secondary | ICD-10-CM | POA: Diagnosis not present

## 2017-04-21 DIAGNOSIS — S6991XA Unspecified injury of right wrist, hand and finger(s), initial encounter: Secondary | ICD-10-CM | POA: Diagnosis present

## 2017-04-21 DIAGNOSIS — Y999 Unspecified external cause status: Secondary | ICD-10-CM | POA: Insufficient documentation

## 2017-04-21 DIAGNOSIS — F17200 Nicotine dependence, unspecified, uncomplicated: Secondary | ICD-10-CM | POA: Insufficient documentation

## 2017-04-21 DIAGNOSIS — Y92410 Unspecified street and highway as the place of occurrence of the external cause: Secondary | ICD-10-CM | POA: Diagnosis not present

## 2017-04-21 DIAGNOSIS — Y9389 Activity, other specified: Secondary | ICD-10-CM | POA: Diagnosis not present

## 2017-04-21 DIAGNOSIS — Z79899 Other long term (current) drug therapy: Secondary | ICD-10-CM | POA: Insufficient documentation

## 2017-04-21 DIAGNOSIS — S63501A Unspecified sprain of right wrist, initial encounter: Secondary | ICD-10-CM | POA: Insufficient documentation

## 2017-04-21 MED ORDER — ACETAMINOPHEN 325 MG PO TABS
650.0000 mg | ORAL_TABLET | Freq: Once | ORAL | Status: AC
  Administered 2017-04-21: 650 mg via ORAL
  Filled 2017-04-21: qty 2
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
## 2017-10-11 ENCOUNTER — Encounter: Payer: Self-pay | Admitting: Family Medicine

## 2017-11-10 ENCOUNTER — Encounter: Payer: BLUE CROSS/BLUE SHIELD | Admitting: Family Medicine

## 2018-01-25 ENCOUNTER — Encounter (HOSPITAL_COMMUNITY): Payer: Self-pay | Admitting: *Deleted

## 2018-01-25 ENCOUNTER — Other Ambulatory Visit: Payer: Self-pay

## 2018-01-25 ENCOUNTER — Emergency Department (HOSPITAL_COMMUNITY)
Admission: EM | Admit: 2018-01-25 | Discharge: 2018-01-25 | Disposition: A | Discharge status: Home / Self Care | Payer: BLUE CROSS/BLUE SHIELD | Attending: Emergency Medicine | Admitting: Emergency Medicine

## 2018-01-25 DIAGNOSIS — F17200 Nicotine dependence, unspecified, uncomplicated: Secondary | ICD-10-CM | POA: Diagnosis not present

## 2018-01-25 DIAGNOSIS — J111 Influenza due to unidentified influenza virus with other respiratory manifestations: Secondary | ICD-10-CM | POA: Diagnosis not present

## 2018-01-25 DIAGNOSIS — R6889 Other general symptoms and signs: Secondary | ICD-10-CM

## 2018-01-25 DIAGNOSIS — R509 Fever, unspecified: Secondary | ICD-10-CM | POA: Diagnosis present

## 2018-01-25 LAB — RAPID STREP SCREEN (MED CTR MEBANE ONLY): Streptococcus, Group A Screen (Direct): NEGATIVE

## 2018-01-25 MED ORDER — DEXAMETHASONE 4 MG PO TABS
10.0000 mg | ORAL_TABLET | Freq: Once | ORAL | Status: AC
  Administered 2018-01-25: 10 mg via ORAL
  Filled 2018-01-25: qty 2

## 2018-01-25 NOTE — Discharge Instructions (Signed)
Take tylenol and ibuprofen for fever and/or body aches. Follow up with your primary care doctor. Rest, drink plenty of fluids to prevent dehydration. Return as needed.

## 2018-01-25 NOTE — ED Provider Notes (Signed)
[facility]-EMERGENCY DEPT Provider Note   CSN: [id number] Arrival date & time: 01/25/18  2016     History   Chief Complaint Chief Complaint  Patient presents with  . Influenza    HPI Taylor White is a 27 y.o. female.  The history is provided by the patient. No language interpreter was used.  Influenza  Presenting symptoms: fatigue, fever, headache, myalgias and sore throat   Presenting symptoms: no cough, no diarrhea, no nausea and no vomiting   Severity:  Moderate Onset quality:  Gradual Duration:  24 hours Progression:  Worsening Chronicity:  New Relieved by:  Nothing Worsened by:  Nothing Associated symptoms: chills and nasal congestion   Associated symptoms: no ear pain     Past Medical History:  Diagnosis Date  . Anemia   . H/O transfusion of packed red blood cells     Patient Active Problem List   Diagnosis Date Noted  . Anemia 08/30/2014  . Menorrhagia 08/30/2014    History reviewed. No pertinent surgical history.  OB History    No data available       Home Medications    Prior to Admission medications   Medication Sig Start Date End Date Taking? Authorizing Provider  oseltamivir (TAMIFLU) 75 MG capsule Take 1 capsule (75 mg total) by mouth every 12 (twelve) hours. 01/17/17   Pisciotta, Mardella Layman    Family History No family history on file.  Social History Social History   Tobacco Use  . Smoking status: Current Every Day Smoker  . Smokeless tobacco: Current User  Substance Use Topics  . Alcohol use: Yes  . Drug use: Yes    Types: Marijuana     Allergies   Patient has no known allergies.   Review of Systems Review of Systems  Constitutional: Positive for chills, fatigue and fever.  HENT: Positive for congestion and sore throat. Negative for ear pain, facial swelling and trouble swallowing.   Eyes: Negative for pain, discharge and redness.  Respiratory: Negative for cough and wheezing.   Cardiovascular:  Negative for chest pain.  Gastrointestinal: Negative for abdominal pain, diarrhea, nausea and vomiting.  Musculoskeletal: Positive for myalgias.  Skin: Negative for rash.  Neurological: Positive for headaches. Negative for syncope.  Hematological: Positive for adenopathy.  Psychiatric/Behavioral: Negative for confusion.     Physical Exam Updated Vital Signs BP 112/79 (BP Location: Left Arm)   Pulse (!) 105   Temp 99.5 F (37.5 C) (Oral)   Resp 17   Ht 5\' 4"  (1.626 m)   Wt 67.1 kg (148 lb)   SpO2 98%   BMI 25.40 kg/m   Physical Exam  Constitutional: She appears well-developed and well-nourished. No distress.  HENT:  Head: Normocephalic.  Right Ear: Tympanic membrane normal.  Left Ear: Tympanic membrane normal.  Nose: Mucosal edema and rhinorrhea present.  Mouth/Throat: Uvula is midline and mucous membranes are normal. Posterior oropharyngeal erythema present. No oropharyngeal exudate or posterior oropharyngeal edema.  Eyes: EOM are normal.  Neck: Neck supple.  Cardiovascular: Regular rhythm. Tachycardia present.  Pulmonary/Chest: Effort normal and breath sounds normal.  Abdominal: Soft. Bowel sounds are normal. There is no tenderness.  Musculoskeletal: Normal range of motion.  Lymphadenopathy:    She has cervical adenopathy.  Neurological: She is alert.  Skin: Skin is warm and dry.  Psychiatric: She has a normal mood and affect. Her behavior is normal.  Nursing note and vitals reviewed.    ED Treatments / Results  Labs (all  labs ordered are listed, but only abnormal results are displayed) Labs Reviewed  RAPID STREP SCREEN (NOT AT [facility])  CULTURE, GROUP A STREP [facility])   Radiology No results found.  Procedures Procedures (including critical care time)  Medications Ordered in ED Medications - No data to display   Initial Impression / Assessment and Plan / ED Course  I have reviewed the triage vital signs and the nursing notes. SUBJECTIVE:  Taylor White is  a 27 y.o. female who present complaining of flu-like symptoms: fevers, chills, myalgias, congestion, sore throat for 1 day. Denies dyspnea or wheezing.  OBJECTIVE: Appears moderately ill but not toxic; temperature as noted in vitals. Ears normal. Throat with erythema but but no exudate, no tonsillar abscess.  Neck supple, no meningeal signs. Sinuses non tender. The chest is clear.  ASSESSMENT: Influenza  PLAN: Symptomatic therapy suggested: rest, increase fluids, gargle prn for sore throat, use mist of vaporizer prn. Return if symptoms worsen.   Final Clinical Impressions(s) / ED Diagnoses   Final diagnoses:  Flu-like symptoms    ED Discharge Orders    None       Kerrie Buffaloeese, [place]inty[place] Alexa BonitaM, [place]NP 01/25/18 2227    Alvira MondaySchlossman, Erin, MD 01/27/18 [phone]

## 2018-01-25 NOTE — ED Triage Notes (Signed)
Pt c/o sore throat and body aches starting yesterday.

## 2018-01-27 LAB — CULTURE, GROUP A STREP (THRC)

## 2018-02-21 DIAGNOSIS — W2209XA Striking against other stationary object, initial encounter: Secondary | ICD-10-CM | POA: Diagnosis not present

## 2018-02-21 DIAGNOSIS — M79675 Pain in left toe(s): Secondary | ICD-10-CM | POA: Diagnosis present

## 2018-02-21 DIAGNOSIS — Y929 Unspecified place or not applicable: Secondary | ICD-10-CM | POA: Diagnosis not present

## 2018-02-21 DIAGNOSIS — S92512A Displaced fracture of proximal phalanx of left lesser toe(s), initial encounter for closed fracture: Secondary | ICD-10-CM | POA: Diagnosis not present

## 2018-02-21 DIAGNOSIS — Y999 Unspecified external cause status: Secondary | ICD-10-CM | POA: Diagnosis not present

## 2018-02-21 DIAGNOSIS — Y939 Activity, unspecified: Secondary | ICD-10-CM | POA: Diagnosis not present

## 2018-02-21 DIAGNOSIS — F17210 Nicotine dependence, cigarettes, uncomplicated: Secondary | ICD-10-CM | POA: Insufficient documentation

## 2018-02-22 ENCOUNTER — Emergency Department (HOSPITAL_COMMUNITY): Payer: BLUE CROSS/BLUE SHIELD

## 2018-02-22 ENCOUNTER — Encounter (HOSPITAL_COMMUNITY): Payer: Self-pay

## 2018-02-22 ENCOUNTER — Emergency Department (HOSPITAL_COMMUNITY)
Admission: EM | Admit: 2018-02-22 | Discharge: 2018-02-22 | Disposition: A | Discharge status: Home / Self Care | Payer: BLUE CROSS/BLUE SHIELD | Attending: Emergency Medicine | Admitting: Emergency Medicine

## 2018-02-22 DIAGNOSIS — S92512A Displaced fracture of proximal phalanx of left lesser toe(s), initial encounter for closed fracture: Secondary | ICD-10-CM

## 2018-02-22 MED ORDER — HYDROCODONE-ACETAMINOPHEN 5-325 MG PO TABS
1.0000 | ORAL_TABLET | Freq: Once | ORAL | Status: AC
  Administered 2018-02-22: 1 via ORAL
  Filled 2018-02-22: qty 1

## 2018-02-22 NOTE — Discharge Instructions (Addendum)
Rest - please stay off left foot as much as possible Ice - ice for 20 minutes at a time, several times a day Wear shoe while up and walking Elevate - elevate the left foot above level of heart Ibuprofen - take with food. Take up to 3-4 times daily Follow up with podiatry

## 2018-02-22 NOTE — ED Provider Notes (Signed)
[facility]-EMERGENCY DEPT Provider Note   CSN: [id number] Arrival date & time: 02/21/18  2327     History   Chief Complaint Chief Complaint  Patient presents with  . Toe Injury    HPI Marlow MELESA LECY is a 27 y.o. female who presents with left 5th toe pain. She states that she stubbed it on a table earlier this evening. She has been able to ambulate but it hurts. Nothing makes it better. Walking and palpation makes it worse.  HPI  Past Medical History:  Diagnosis Date  . Anemia   . H/O transfusion of packed red blood cells     Patient Active Problem List   Diagnosis Date Noted  . Anemia 08/30/2014  . Menorrhagia 08/30/2014    History reviewed. No pertinent surgical history.   OB History   None      Home Medications    Prior to Admission medications   Medication Sig Start Date End Date Taking? Authorizing Provider  oseltamivir (TAMIFLU) 75 MG capsule Take 1 capsule (75 mg total) by mouth every 12 (twelve) hours. 01/17/17   Pisciotta, Mardella Layman    Family History History reviewed. No pertinent family history.  Social History Social History   Tobacco Use  . Smoking status: Current Every Day Smoker  . Smokeless tobacco: Current User  Substance Use Topics  . Alcohol use: Yes  . Drug use: Yes    Types: Marijuana     Allergies   Patient has no known allergies.   Review of Systems Review of Systems  Musculoskeletal: Positive for arthralgias.  Skin: Negative for wound.     Physical Exam Updated Vital Signs BP 117/81 (BP Location: Left Arm)   Pulse 91   Temp 98.8 F (37.1 C) (Oral)   Resp 15   Ht 5\' 5"  (1.651 m)   Wt 66.7 kg (147 lb)   LMP 02/22/2018   SpO2 100%   BMI 24.46 kg/m   Physical Exam  Constitutional: She is oriented to person, place, and time. She appears well-developed and well-nourished. No distress.  HENT:  Head: Normocephalic and atraumatic.  Eyes: Pupils are equal, round, and reactive to light.  Conjunctivae are normal. Right eye exhibits no discharge. Left eye exhibits no discharge. No scleral icterus.  Neck: Normal range of motion.  Cardiovascular: Normal rate.  Pulmonary/Chest: Effort normal. No respiratory distress.  Abdominal: She exhibits no distension.  Musculoskeletal:  Left foot: 5th toe is mildly angulated laterally. No open wounds. Tenderness to palpation. 2+ DP pulse.  Neurological: She is alert and oriented to person, place, and time.  Skin: Skin is warm and dry.  Psychiatric: She has a normal mood and affect. Her behavior is normal.  Nursing note and vitals reviewed.    ED Treatments / Results  Labs (all labs ordered are listed, but only abnormal results are displayed) Labs Reviewed - No data to display  EKG None  Radiology Dg Foot Complete Left  Result Date: 02/22/2018 CLINICAL DATA:  27 year old female with trauma to the foot and deformity of the fifth digit. EXAM: LEFT FOOT - COMPLETE 3+ VIEW COMPARISON:  None. FINDINGS: There is a mildly displaced oblique fracture of the mid to distal portion of the proximal phalanx of the fifth digit with mild lateral and plantar angulation of the distal fracture fragment. No other acute fracture identified. The bones are well mineralized. No arthritic changes. Mild soft tissue swelling of the forefoot. No radiopaque foreign object. IMPRESSION: Minimally displaced fracture of  the proximal phalanx of the fifth digit. Electronically Signed   By: Elgie CollardArash  Radparvar M.D.   On: 02/22/2018 01:19    Procedures Procedures (including critical care time)  Medications Ordered in ED Medications - No data to display   Initial Impression / Assessment and Plan / ED Course  I have reviewed the triage vital signs and the nursing notes.  Pertinent labs & imaging results that were available during my care of the patient were reviewed by me and considered in my medical decision making (see chart for details).  27 year old female with  left fifth toe pain after blunt trauma.  X-ray is remarkable for a minimally displaced fracture of the proximal phalanx.  She does have some angulation on exam however it is mild and do not think will benefit from reduction.  She had visit with Dr. Rhunette CroftNanavati.  Will buddy tape and place in a postop shoe.  She was given a work note and advised to follow-up with podiatry.  Final Clinical Impressions(s) / ED Diagnoses   Final diagnoses:  Closed displaced fracture of proximal phalanx of lesser toe of left foot, initial encounter    ED Discharge Orders    None       Bethel BornGekas, Eiden Bagot Marie, PA-C 02/22/18 0421    Derwood KaplanNanavati, Ankit, MD 02/22/18 [phone]

## 2018-02-22 NOTE — ED Triage Notes (Signed)
Pt hit her left pinky toe on the coffee table, her toe is pointing the wrong direction

## 2018-02-22 NOTE — ED Notes (Signed)
Bed: WTR5 Expected date:  Expected time:  Means of arrival:  Comments: 

## 2020-06-05 ENCOUNTER — Encounter (HOSPITAL_COMMUNITY): Payer: Self-pay

## 2020-06-05 ENCOUNTER — Emergency Department (HOSPITAL_COMMUNITY)
Admission: EM | Admit: 2020-06-05 | Discharge: 2020-06-05 | Disposition: A | Discharge status: Home / Self Care | Payer: BLUE CROSS/BLUE SHIELD | Attending: Emergency Medicine | Admitting: Emergency Medicine

## 2020-06-05 DIAGNOSIS — R112 Nausea with vomiting, unspecified: Secondary | ICD-10-CM | POA: Insufficient documentation

## 2020-06-05 DIAGNOSIS — R531 Weakness: Secondary | ICD-10-CM | POA: Insufficient documentation

## 2020-06-05 DIAGNOSIS — Z5321 Procedure and treatment not carried out due to patient leaving prior to being seen by health care provider: Secondary | ICD-10-CM | POA: Insufficient documentation

## 2020-06-05 DIAGNOSIS — R42 Dizziness and giddiness: Secondary | ICD-10-CM | POA: Insufficient documentation

## 2020-06-05 LAB — BASIC METABOLIC PANEL
Anion gap: 13 (ref 5–15)
BUN: 7 mg/dL (ref 6–20)
CO2: 19 mmol/L — ABNORMAL LOW (ref 22–32)
Calcium: 9 mg/dL (ref 8.9–10.3)
Chloride: 104 mmol/L (ref 98–111)
Creatinine, Ser: 0.83 mg/dL (ref 0.44–1.00)
GFR calc Af Amer: >60 mL/min (ref >60)
GFR calc non Af Amer: >60 mL/min (ref >60)
Glucose, Bld: 80 mg/dL (ref 70–99)
Potassium: 3.5 mmol/L (ref 3.5–5.1)
Sodium: 136 mmol/L (ref 135–145)

## 2020-06-05 LAB — CBC
HCT: 36.7 % (ref 36.0–46.0)
Hemoglobin: 12 g/dL (ref 12.0–15.0)
MCH: 29.1 pg (ref 26.0–34.0)
MCHC: 32.7 g/dL (ref 30.0–36.0)
MCV: 88.9 fL (ref 80.0–100.0)
Platelets: 233 10*3/uL (ref 150–400)
RBC: 4.13 MIL/uL (ref 3.87–5.11)
RDW: 14.8 % (ref 11.5–15.5)
WBC: 4.4 10*3/uL (ref 4.0–10.5)
nRBC: 0 % (ref 0.0–0.2)

## 2020-06-05 LAB — URINALYSIS, ROUTINE W REFLEX MICROSCOPIC
Bilirubin Urine: NEGATIVE
Glucose, UA: NEGATIVE mg/dL
Hgb urine dipstick: NEGATIVE
Ketones, ur: 80 mg/dL — AB
Leukocytes,Ua: NEGATIVE
Nitrite: NEGATIVE
Protein, ur: NEGATIVE mg/dL
Specific Gravity, Urine: 1.015 (ref 1.005–1.030)
pH: 6 (ref 5.0–8.0)

## 2020-06-05 LAB — I-STAT BETA HCG BLOOD, ED (MC, WL, AP ONLY): I-stat hCG, quantitative: <5 m[IU]/mL (ref <5)

## 2020-06-05 NOTE — ED Triage Notes (Signed)
Pt BIB [facility] from an urgent care that was closed for eval of R arm pain, N/V, dizziness onset today. Pt reports generalized body aches and malaise onset yesterday. Denies any known trauma/injury to R arm, stroke screen negative. Pt w/ generalized weakness that is not unilateral, no other neuro deficits.

## 2020-06-05 NOTE — ED Notes (Addendum)
Pt states she is just going to call her provider, she does not want to wait

## 2020-06-06 ENCOUNTER — Other Ambulatory Visit: Payer: Self-pay

## 2020-06-06 ENCOUNTER — Ambulatory Visit (HOSPITAL_COMMUNITY)
Admission: EM | Admit: 2020-06-06 | Discharge: 2020-06-06 | Disposition: A | Discharge status: Home / Self Care | Payer: Self-pay

## 2020-06-06 ENCOUNTER — Encounter (HOSPITAL_COMMUNITY): Payer: Self-pay | Admitting: Emergency Medicine

## 2020-06-06 DIAGNOSIS — R5383 Other fatigue: Secondary | ICD-10-CM

## 2020-06-06 DIAGNOSIS — R252 Cramp and spasm: Secondary | ICD-10-CM

## 2020-06-06 DIAGNOSIS — R531 Weakness: Secondary | ICD-10-CM

## 2020-06-06 DIAGNOSIS — R824 Acetonuria: Secondary | ICD-10-CM

## 2020-06-06 DIAGNOSIS — M6289 Other specified disorders of muscle: Secondary | ICD-10-CM

## 2020-06-06 NOTE — ED Notes (Signed)
Yesterday when all this was taking place, patient was trying to get a place to live.  Currently her lease is ending soon and is having difficulty securing another place and the people helping her with this process are stressing her.
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
## 2020-09-16 ENCOUNTER — Encounter (HOSPITAL_COMMUNITY): Payer: Self-pay

## 2020-09-16 ENCOUNTER — Emergency Department (HOSPITAL_COMMUNITY)
Admission: EM | Admit: 2020-09-16 | Discharge: 2020-09-16 | Disposition: A | Discharge status: Home / Self Care | Payer: Self-pay | Attending: Emergency Medicine | Admitting: Emergency Medicine

## 2020-09-16 ENCOUNTER — Emergency Department (HOSPITAL_COMMUNITY): Payer: Self-pay

## 2020-09-16 DIAGNOSIS — M25511 Pain in right shoulder: Secondary | ICD-10-CM | POA: Insufficient documentation

## 2020-09-16 DIAGNOSIS — Z5321 Procedure and treatment not carried out due to patient leaving prior to being seen by health care provider: Secondary | ICD-10-CM | POA: Insufficient documentation
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
## 2020-09-16 NOTE — ED Notes (Signed)
PATIENT LEFT WITHOUT BEING SEEN BY THE DOCTOR.

## 2021-07-21 ENCOUNTER — Emergency Department (HOSPITAL_BASED_OUTPATIENT_CLINIC_OR_DEPARTMENT_OTHER)
Admission: EM | Admit: 2021-07-21 | Discharge: 2021-07-21 | Disposition: A | Discharge status: Home / Self Care | Payer: BC Managed Care – PPO | Attending: Emergency Medicine | Admitting: Emergency Medicine

## 2021-07-21 ENCOUNTER — Encounter (HOSPITAL_BASED_OUTPATIENT_CLINIC_OR_DEPARTMENT_OTHER): Payer: Self-pay | Admitting: Urology

## 2021-07-21 ENCOUNTER — Emergency Department (HOSPITAL_BASED_OUTPATIENT_CLINIC_OR_DEPARTMENT_OTHER): Payer: BC Managed Care – PPO

## 2021-07-21 ENCOUNTER — Other Ambulatory Visit: Payer: Self-pay

## 2021-07-21 DIAGNOSIS — S8002XA Contusion of left knee, initial encounter: Secondary | ICD-10-CM | POA: Diagnosis not present

## 2021-07-21 DIAGNOSIS — F17200 Nicotine dependence, unspecified, uncomplicated: Secondary | ICD-10-CM | POA: Insufficient documentation

## 2021-07-21 DIAGNOSIS — Y92410 Unspecified street and highway as the place of occurrence of the external cause: Secondary | ICD-10-CM | POA: Insufficient documentation

## 2021-07-21 DIAGNOSIS — M791 Myalgia, unspecified site: Secondary | ICD-10-CM | POA: Diagnosis not present

## 2021-07-21 DIAGNOSIS — M549 Dorsalgia, unspecified: Secondary | ICD-10-CM | POA: Diagnosis not present

## 2021-07-21 DIAGNOSIS — S8992XA Unspecified injury of left lower leg, initial encounter: Secondary | ICD-10-CM | POA: Diagnosis present

## 2021-07-21 MED ORDER — METHOCARBAMOL 500 MG PO TABS: 500.0000 mg | ORAL_TABLET | Freq: Two times a day (BID) | ORAL | 0 refills | Status: DC

## 2021-07-21 MED ORDER — ACETAMINOPHEN 500 MG PO TABS
1000.0000 mg | ORAL_TABLET | Freq: Once | ORAL | Status: AC
  Administered 2021-07-21: 1000 mg via ORAL
  Filled 2021-07-21: qty 2

## 2021-07-21 NOTE — ED Notes (Signed)
Appears in no distress.
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
## 2021-07-21 NOTE — Discharge Instructions (Addendum)
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
## 2022-11-28 ENCOUNTER — Other Ambulatory Visit: Payer: Self-pay

## 2022-11-28 ENCOUNTER — Ambulatory Visit (HOSPITAL_COMMUNITY)
Admission: EM | Admit: 2022-11-28 | Discharge: 2022-11-28 | Disposition: A | Discharge status: Home / Self Care | Payer: BC Managed Care – PPO | Attending: Emergency Medicine | Admitting: Emergency Medicine

## 2022-11-28 ENCOUNTER — Encounter (HOSPITAL_COMMUNITY): Payer: Self-pay | Admitting: *Deleted

## 2022-11-28 DIAGNOSIS — B349 Viral infection, unspecified: Secondary | ICD-10-CM | POA: Insufficient documentation

## 2022-11-28 DIAGNOSIS — Z20822 Contact with and (suspected) exposure to covid-19: Secondary | ICD-10-CM | POA: Diagnosis present
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
## 2022-11-28 MED ORDER — IBUPROFEN 800 MG PO TABS
800.0000 mg | ORAL_TABLET | Freq: Once | ORAL | Status: AC
  Administered 2022-11-28: 800 mg via ORAL

## 2022-11-28 MED ORDER — IBUPROFEN 800 MG PO TABS
ORAL_TABLET | ORAL | Status: AC
  Filled 2022-11-28: qty 1

## 2022-11-28 NOTE — ED Triage Notes (Signed)
Pt reports HA since Wed. Pt had a positive home COVID test on WED.

## 2022-11-28 NOTE — Discharge Instructions (Addendum)
We will call you if any of your test results are positive, you may view these test results on MyChart.   You were given 800 mg of ibuprofen in office today. You can take ibuprofen every 6 hours (400-600 mg) , do not take more than 2400 mg in a 24-hour day.  I advised that you do not take ibuprofen on an empty stomach, ibuprofen can cause GI problems such as GI bleeding.   Viral illnesses usually takes 7 to 10 days to resolve.    Maintaining hydration status is very important, please drink at least 8 cups of water daily.  Please try to intake nutrient dense meals.

## 2022-11-28 NOTE — ED Provider Notes (Signed)
[facility]    CSN: [id number] Arrival date & time: 11/28/22  1337      History   Chief Complaint Chief Complaint  Patient presents with   Headache    HPI Taylor White is a 31 y.o. female.  Patient presents complaining of headache and fatigue that started 3 days ago.  Patient reports that she took an at home COVID test which had a positive result.  She states that she has had some intermittent left-sided ear pain. She denies any nasal congestion or cough.  History of migraines.  She states that she has taken Tylenol and TheraFlu at home with minimal relief of symptoms. She reports that she wants a COVID test performed for her job.    Headache Associated symptoms: fatigue   Associated symptoms: no abdominal pain, no congestion, no cough, no diarrhea, no drainage, no ear pain, no fever, no nausea, no photophobia, no sinus pressure, no sore throat and no vomiting     Past Medical History:  Diagnosis Date   Anemia    H/O transfusion of packed red blood cells     Patient Active Problem List   Diagnosis Date Noted   Anemia 08/30/2014   Menorrhagia 08/30/2014    History reviewed. No pertinent surgical history.  OB History   No obstetric history on file.      Home Medications    Prior to Admission medications   Not on File    Family History History reviewed. No pertinent family history.  Social History Social History   Tobacco Use   Smoking status: Former   Smokeless tobacco: Current  Substance Use Topics   Alcohol use: Yes   Drug use: Not Currently    Types: Marijuana     Allergies   Patient has no known allergies.   Review of Systems Review of Systems  Constitutional:  Positive for fatigue. Negative for activity change, appetite change, chills and fever.  HENT:  Negative for congestion, ear discharge, ear pain, nosebleeds, postnasal drip, rhinorrhea, sinus pressure, sinus pain, sore throat, trouble swallowing and voice change.   Eyes:  Negative.  Negative for photophobia.  Respiratory:  Negative for cough, chest tightness, shortness of breath and wheezing.   Cardiovascular:  Negative for chest pain and palpitations.  Gastrointestinal:  Negative for abdominal pain, diarrhea, nausea and vomiting.  Neurological:  Positive for headaches.     Physical Exam Triage Vital Signs ED Triage Vitals  Enc Vitals Group     BP 11/28/22 1704 113/75     Pulse Rate 11/28/22 1704 95     Resp 11/28/22 1704 18     Temp 11/28/22 1704 98.3 F (36.8 C)     Temp src --      SpO2 11/28/22 1704 100 %     Weight --      Height --      Head Circumference --      Peak Flow --      Pain Score 11/28/22 1702 7     Pain Loc --      Pain Edu? --      Excl. in GC? --    No data found.  Updated Vital Signs BP 113/75   Pulse 95   Temp 98.3 F (36.8 C)   Resp 18   LMP 11/25/2022   SpO2 100%      Physical Exam Vitals and nursing note reviewed.  HENT:     Right Ear: Hearing, tympanic membrane, ear canal  and external ear normal.     Left Ear: Hearing, tympanic membrane, ear canal and external ear normal.     Nose: No congestion or rhinorrhea.     Right Turbinates: Not enlarged, swollen or pale.     Left Turbinates: Enlarged and swollen. Not pale.     Right Sinus: No maxillary sinus tenderness or frontal sinus tenderness.     Left Sinus: No maxillary sinus tenderness or frontal sinus tenderness.     Mouth/Throat:     Mouth: Mucous membranes are moist.     Dentition: Normal dentition.     Pharynx: No pharyngeal swelling, oropharyngeal exudate, posterior oropharyngeal erythema or uvula swelling.     Tonsils: No tonsillar exudate or tonsillar abscesses. 0 on the right. 0 on the left.  Cardiovascular:     Rate and Rhythm: Normal rate and regular rhythm.     Heart sounds: Normal heart sounds, S1 normal and S2 normal.  Pulmonary:     Effort: Pulmonary effort is normal.     Breath sounds: Normal breath sounds and air entry. No decreased  breath sounds, wheezing, rhonchi or rales.  Lymphadenopathy:     Cervical: No cervical adenopathy.  Neurological:     General: No focal deficit present.     GCS: GCS eye subscore is 4. GCS verbal subscore is 5. GCS motor subscore is 6.      UC Treatments / Results  Labs (all labs ordered are listed, but only abnormal results are displayed) Labs Reviewed  SARS CORONAVIRUS 2 (TAT 6-24 HRS) - Abnormal; Notable for the following components:      Result Value   SARS Coronavirus 2 POSITIVE (*)    All other components within normal limits    EKG   Radiology No results found.  Procedures Procedures (including critical care time)  Medications Ordered in UC Medications  ibuprofen (ADVIL) tablet 800 mg (800 mg Oral Given 11/28/22 1803)    Initial Impression / Assessment and Plan / UC Course  I have reviewed the triage vital signs and the nursing notes.  Pertinent labs & imaging results that were available during my care of the patient were reviewed by me and considered in my medical decision making (see chart for details).     Patient was treated for viral illness. Ibuprofen 800 mg given in office for headache. High suspicion of COVID-19 based on patient symptomology and patients statement.  COVID test performed per patient request.  Patient was made aware of symptom management of a viral illness.  Patient made aware of timeline for symptom resolution and when follow-up would be necessary.  Patient made aware of results reporting protocol and MyChart.  Work note was given.  Patient verbalized understanding of instructions.    Charting was provided using a a verbal dictation system, charting was proofread for errors, errors may occur which could change the meaning of the information charted.   Final Clinical Impressions(s) / UC Diagnoses   Final diagnoses:  Viral illness  Encounter for laboratory testing for COVID-19 virus     Discharge Instructions      We will call you if  any of your test results are positive, you may view these test results on MyChart.   You were given 800 mg of ibuprofen in office today. You can take ibuprofen every 6 hours (400-600 mg) , do not take more than 2400 mg in a 24-hour day.  I advised that you do not take ibuprofen on an empty stomach, ibuprofen  can cause GI problems such as GI bleeding.   Viral illnesses usually takes 7 to 10 days to resolve.    Maintaining hydration status is very important, please drink at least 8 cups of water daily.  Please try to intake nutrient dense meals.      ED Prescriptions   None    PDMP not reviewed this encounter.   Debby Freiberg, NP 11/29/22 1005

## 2022-11-29 LAB — SARS CORONAVIRUS 2 (TAT 6-24 HRS): SARS Coronavirus 2: POSITIVE — AB

## 2024-09-17 ENCOUNTER — Encounter (HOSPITAL_COMMUNITY): Payer: Self-pay

## 2024-09-17 ENCOUNTER — Ambulatory Visit (HOSPITAL_COMMUNITY)
Admission: EM | Admit: 2024-09-17 | Discharge: 2024-09-17 | Disposition: A | Discharge status: Home / Self Care | Attending: Nurse Practitioner | Admitting: Nurse Practitioner

## 2024-09-17 DIAGNOSIS — K0889 Other specified disorders of teeth and supporting structures: Secondary | ICD-10-CM

## 2024-09-17 MED ORDER — IBUPROFEN 800 MG PO TABS
800.0000 mg | ORAL_TABLET | Freq: Once | ORAL | Status: AC
  Administered 2024-09-17: 800 mg via ORAL

## 2024-09-17 MED ORDER — IBUPROFEN 800 MG PO TABS
ORAL_TABLET | ORAL | Status: AC
  Filled 2024-09-17: qty 1

## 2024-09-17 MED ORDER — LIDOCAINE VISCOUS HCL 2 % MT SOLN: 15.0000 mL | OROMUCOSAL | 0 refills | Status: AC | PRN

## 2024-09-17 MED ORDER — IBUPROFEN 800 MG PO TABS: 800.0000 mg | ORAL_TABLET | Freq: Three times a day (TID) | ORAL | 0 refills | Status: AC

## 2024-09-17 NOTE — Discharge Instructions (Addendum)
 You do not appear to have a dental infection today.  Please follow up with a Dentist ASAP regarding the dental pain - resource guide has been attached.  In the meantime, start taking the ibuprofen  every 8 hours with food to help with pain.  You can also use the lidocaine rinses as needed to help with dental pain.  Seek care if symptoms worsen despite treatment.

## 2024-09-17 NOTE — ED Triage Notes (Signed)
 Here for dental pain on the right side x 2 days.  Pain 10/10

## 2024-09-17 NOTE — ED Provider Notes (Signed)
 [facility]    CSN: [id number] Arrival date & time: 09/17/24  1540      History   Chief Complaint No chief complaint on file.   HPI Taylor White is a 33 y.o. female.   Patient presents today with 2-week history of right upper dental pain.  Reports it has been worse in the past 3 days.  No fevers or nausea/vomiting or sinus pressure.  Has been difficult to eat or chew on the right side.  No known broken teeth, does not currently have a dentist.  Has been taking Tylenol  PM which helps minimally and oral gel which also helps minimally.  No history of similar.    Past Medical History:  Diagnosis Date   Anemia    H/O transfusion of packed red blood cells     Patient Active Problem List   Diagnosis Date Noted   Anemia 08/30/2014   Menorrhagia 08/30/2014    History reviewed. No pertinent surgical history.  OB History   No obstetric history on file.      Home Medications    Prior to Admission medications   Medication Sig Start Date End Date Taking? Authorizing Provider  ibuprofen  (ADVIL ) 800 MG tablet Take 1 tablet (800 mg total) by mouth 3 (three) times daily. Take with food to prevent GI upset 09/17/24  Yes Chandra Raisin A, NP  lidocaine (XYLOCAINE) 2 % solution Use as directed 15 mLs in the mouth or throat every 3 (three) hours as needed for mouth pain. 09/17/24  Yes Chandra Raisin LABOR, NP    Family History History reviewed. No pertinent family history.  Social History Social History   Tobacco Use   Smoking status: Former   Smokeless tobacco: Current  Substance Use Topics   Alcohol use: Yes   Drug use: Not Currently    Types: Marijuana     Allergies   Patient has no allergy information on record.   Review of Systems Review of Systems Per HPI  Physical Exam Triage Vital Signs ED Triage Vitals  Encounter Vitals Group     BP 09/17/24 1648 103/63     Girls Systolic BP Percentile --      Girls Diastolic BP Percentile --      Boys  Systolic BP Percentile --      Boys Diastolic BP Percentile --      Pulse Rate 09/17/24 1648 80     Resp 09/17/24 1648 16     Temp 09/17/24 1648 98.7 F (37.1 C)     Temp Source 09/17/24 1648 Oral     SpO2 09/17/24 1648 97 %     Weight --      Height --      Head Circumference --      Peak Flow --      Pain Score 09/17/24 1649 10     Pain Loc --      Pain Education --      Exclude from Growth Chart --    No data found.  Updated Vital Signs BP 103/63 (BP Location: Left Arm)   Pulse 80   Temp 98.7 F (37.1 C) (Oral)   Resp 16   LMP 09/10/2024 (Approximate)   SpO2 97%   Visual Acuity Right Eye Distance:   Left Eye Distance:   Bilateral Distance:    Right Eye Near:   Left Eye Near:    Bilateral Near:     Physical Exam Vitals and nursing note reviewed.  Constitutional:  General: She is not in acute distress.    Appearance: Normal appearance. She is not toxic-appearing.  HENT:     Right Ear: External ear normal.     Left Ear: External ear normal.     Mouth/Throat:     Mouth: Mucous membranes are moist. No oral lesions.     Dentition: Normal dentition. No gingival swelling, dental caries, dental abscesses or gum lesions.     Pharynx: Oropharynx is clear.      Comments: Dental tenderness to area marked; no obvious abscess or gingivitis Pulmonary:     Effort: Pulmonary effort is normal. No respiratory distress.  Musculoskeletal:     Cervical back: Normal range of motion.  Lymphadenopathy:     Cervical: No cervical adenopathy.  Skin:    General: Skin is warm and dry.     Capillary Refill: Capillary refill takes less than 2 seconds.  Neurological:     Mental Status: She is alert and oriented to person, place, and time.  Psychiatric:        Behavior: Behavior is cooperative.      UC Treatments / Results  Labs (all labs ordered are listed, but only abnormal results are displayed) Labs Reviewed - No data to display  EKG   Radiology No results  found.  Procedures Procedures (including critical care time)  Medications Ordered in UC Medications  ibuprofen  (ADVIL ) tablet 800 mg (has no administration in time range)    Initial Impression / Assessment and Plan / UC Course  I have reviewed the triage vital signs and the nursing notes.  Pertinent labs & imaging results that were available during my care of the patient were reviewed by me and considered in my medical decision making (see chart for details).   Patient is well-appearing, normotensive, afebrile, not tachycardic, not tachypneic, oxygenating well on room air.   1. Dentalgia No obvious signs of abscess Treat with anti-inflammatories, lidocaine Dental resource guide given for follow-up Return precautions and ER precautions discussed  The patient was given the opportunity to ask questions.  All questions answered to their satisfaction.  The patient is in agreement to this plan.   Final Clinical Impressions(s) / UC Diagnoses   Final diagnoses:  Dentalgia     Discharge Instructions      You do not appear to have a dental infection today.  Please follow up with a Dentist ASAP regarding the dental pain - resource guide has been attached.  In the meantime, start taking the ibuprofen  every 8 hours with food to help with pain.  You can also use the lidocaine rinses as needed to help with dental pain.  Seek care if symptoms worsen despite treatment.   ED Prescriptions     Medication Sig Dispense Auth. Provider   ibuprofen  (ADVIL ) 800 MG tablet Take 1 tablet (800 mg total) by mouth 3 (three) times daily. Take with food to prevent GI upset 30 tablet Chandra Raisin A, NP   lidocaine (XYLOCAINE) 2 % solution Use as directed 15 mLs in the mouth or throat every 3 (three) hours as needed for mouth pain. 100 mL Chandra Raisin LABOR, NP      PDMP not reviewed this encounter.   Chandra Raisin LABOR, NP 09/17/24 [phone]
